# Patient Record
Sex: Female | Born: 2000 | Race: White | Hispanic: No | Marital: Single | Smoking: Never smoker
Health system: Southern US, Community
[De-identification: ages and names within clinical notes are randomized; demographics above are authoritative.]

## PROBLEM LIST (undated history)

## (undated) DIAGNOSIS — J45909 Unspecified asthma, uncomplicated: Secondary | ICD-10-CM

---
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
## 2018-09-07 IMAGING — CR DG THORACIC SPINE 2V
3 series · 3 of 3 positions shown · non-contrast
Comparison: None.

CLINICAL DATA: Back pain

EXAM:
THORACIC SPINE 2 VIEWS

[t thoracic spine ap]
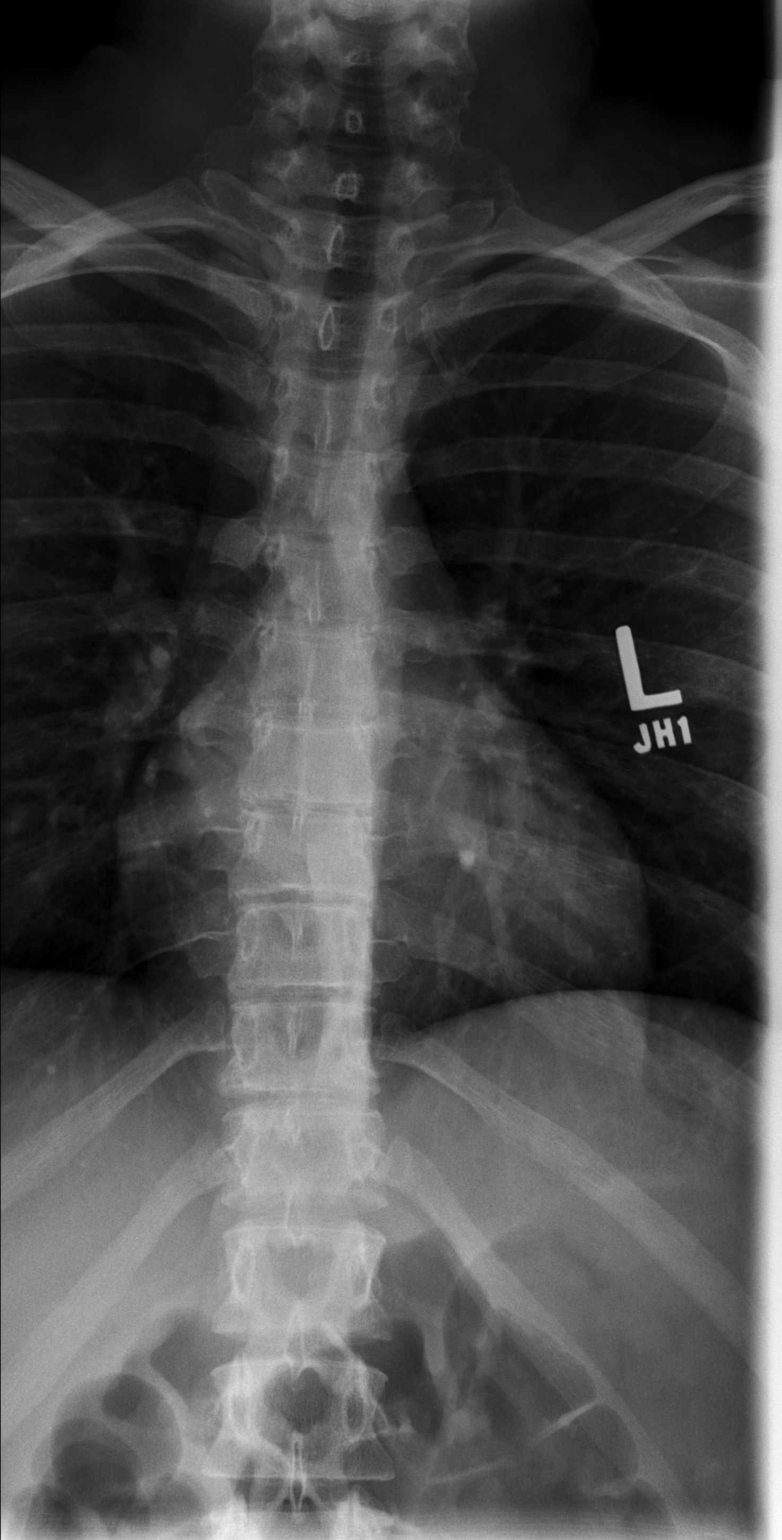

[t thoracic spine lat]
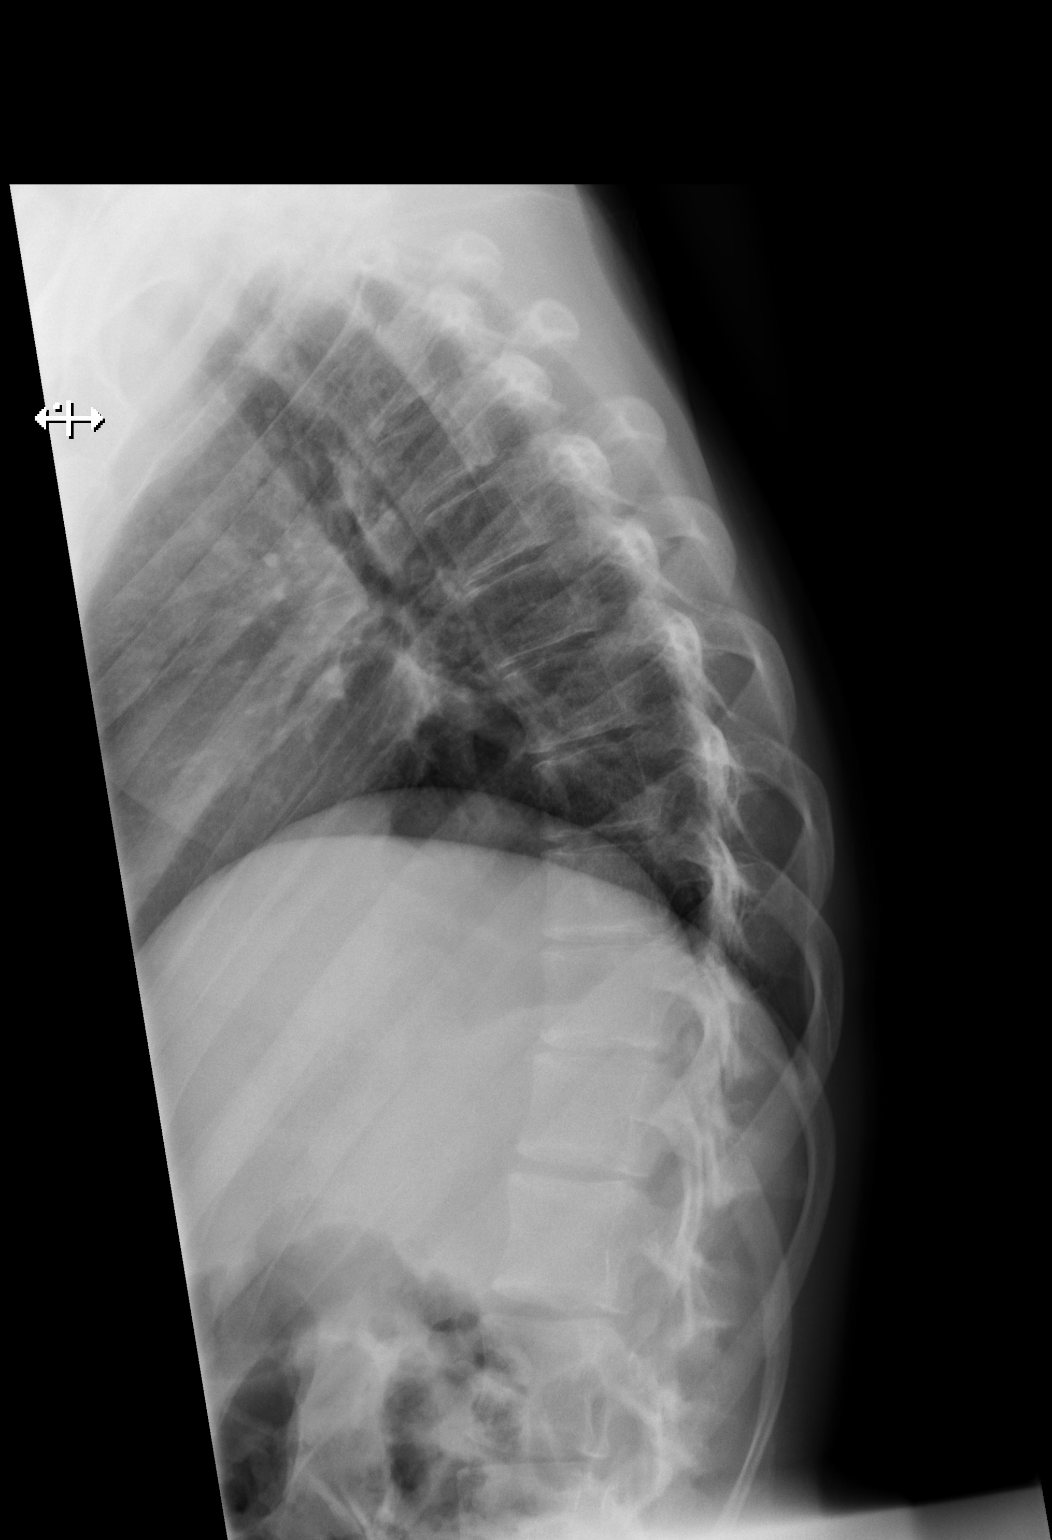

[t thoracic swimmers]
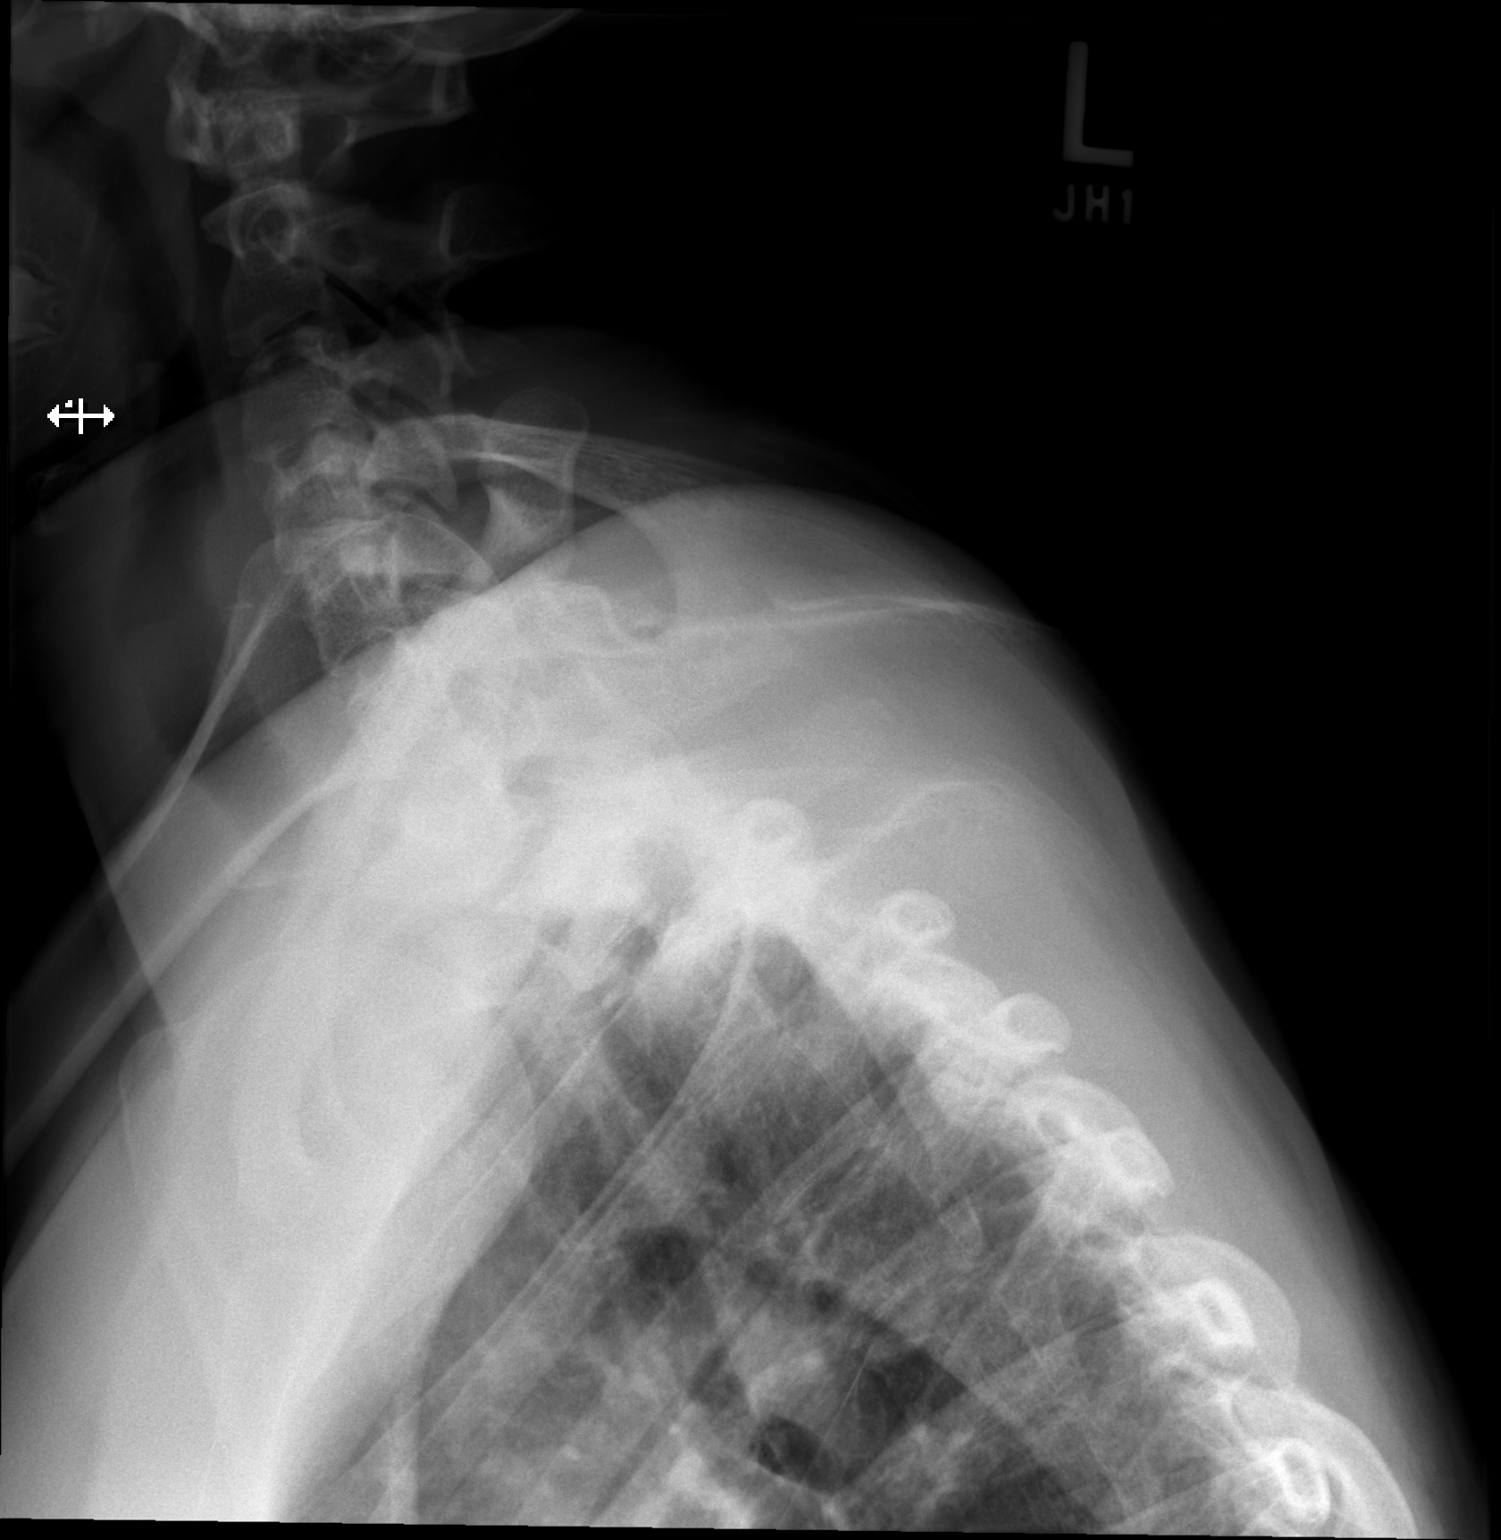

[3 of 3 positions shown; findings below may reference images not displayed]

FINDINGS: Eleven rib pairs. Mild thoracic kyphosis. Vertebral body heights are
normal. Disc spaces are maintained.
IMPRESSION: Negative.

## 2021-07-26 ENCOUNTER — Telehealth: Payer: Self-pay | Admitting: General Practice

## 2021-07-26 ENCOUNTER — Encounter: Payer: Self-pay | Admitting: Family Medicine

## 2021-07-26 ENCOUNTER — Ambulatory Visit (INDEPENDENT_AMBULATORY_CARE_PROVIDER_SITE_OTHER): Payer: 59 | Admitting: Family Medicine

## 2021-07-26 VITALS — BP 116/62 | HR 87 | Wt 163.0 lb

## 2021-07-26 DIAGNOSIS — Z3400 Encounter for supervision of normal first pregnancy, unspecified trimester: Secondary | ICD-10-CM

## 2021-07-26 DIAGNOSIS — O3680X0 Pregnancy with inconclusive fetal viability, not applicable or unspecified: Secondary | ICD-10-CM | POA: Diagnosis not present

## 2021-07-26 NOTE — Progress Notes (Signed)
Last Pap: 04/23/2020 at [facility].  Loghan Kurtzman l Shequita Peplinski, CMA

## 2021-07-26 NOTE — Progress Notes (Signed)
   Subjective:    Patient ID: Catherine Collier, female    DOB: 09-Jul-2000, 21 y.o.   MRN: [id number]  HPI  Patient seen for initial OB exam, however has very irregular periods and a goes 6 to 7 weeks with between periods at times.  Having mild nausea.  LMP 5/14, which would put her at 7 weeks and 5 days.  Review of Systems     Objective:   Physical Exam Vitals reviewed.  Constitutional:      Appearance: Normal appearance.  Skin:    Capillary Refill: Capillary refill takes less than 2 seconds.  Neurological:     General: No focal deficit present.     Mental Status: She is alert.  Psychiatric:        Mood and Affect: Mood normal.        Behavior: Behavior normal.        Thought Content: Thought content normal.       Assessment & Plan:  1. Pregnancy with uncertain fetal viability, single or unspecified fetus Discussed results.  She has an intrauterine gestational sac with yolk sac that measures 5 weeks 5 days.  We will follow-up in 2 weeks for viability.  Discussed warning signs of miscarriage including bleeding, spotting, cramping.  Patient to call with any vaginal bleeding.  Also relayed to the patient that she can go to the maternity assessment unit at [facility] for any emergent needs.

## 2021-07-26 NOTE — Telephone Encounter (Signed)
Patient aware of insurance ([employer]) will not be in network with [employer] effective 09/20/2021.  Pt verbalized understanding.

## 2021-07-26 NOTE — Progress Notes (Signed)
DATING AND VIABILITY SONOGRAM   Catherine Collier is a 21 y.o. year old G1P0 with LMP Patient's last menstrual period was 06/02/2021. which would correlate to  [redacted]w[redacted]d weeks gestation.  She has irregular menstrual cycles.   She is here today for a confirmatory initial sonogram.    GESTATION: SINGLETON     FETAL ACTIVITY:        Gestational sac visualized   ADNEXA: The ovaries are normal.   GESTATIONAL AGE AND  BIOMETRICS:  Gestational criteria: Estimated Date of Delivery: 03/09/22 by LMP now at [redacted]w[redacted]d  Previous Scans:0  GESTATIONAL SAC           1.13 mm         5.5 weeks      1.04 cm      5-5 weeks                                                                               AVERAGE EGA(BY THIS SCAN):  5-5 weeks  WORKING EDD( early ultrasound ):  03-23-2022  Will plan to repeat ultrasound in 2 weeks.     TECHNICIAN COMMENTS:  Patient informed that the ultrasound is considered a limited obstetric ultrasound and is not intended to be a complete ultrasound exam.  Patient also informed that the ultrasound is not being completed with the intent of assessing for fetal or placental anomalies or any pelvic abnormalities. Explained that the purpose of today's ultrasound is to assess for fetal heart rate.  Patient acknowledges the purpose of the exam and the limitations of the study.    Armandina Stammer 07/26/2021 11:14 AM

## 2021-07-29 ENCOUNTER — Encounter: Payer: Self-pay | Admitting: General Practice

## 2021-08-09 ENCOUNTER — Ambulatory Visit (INDEPENDENT_AMBULATORY_CARE_PROVIDER_SITE_OTHER): Payer: 59

## 2021-08-09 VITALS — BP 116/61 | HR 86

## 2021-08-09 DIAGNOSIS — Z3400 Encounter for supervision of normal first pregnancy, unspecified trimester: Secondary | ICD-10-CM

## 2021-08-09 DIAGNOSIS — O3680X0 Pregnancy with inconclusive fetal viability, not applicable or unspecified: Secondary | ICD-10-CM

## 2021-08-09 NOTE — Progress Notes (Signed)
Patient presents for repeat ultrasound for viability. DATING AND VIABILITY SONOGRAM   Catherine Collier is a 21 y.o. year old G1P0 with LMP Patient's last menstrual period was 06/02/2021. which would correlate to  [redacted]w[redacted]d weeks gestation.  She has regular menstrual cycles.   She is here today for a confirmatory initial sonogram.    GESTATION: SINGLETON     FETAL ACTIVITY:          Heart rate         143 bpm          The fetus is active.    ADNEXA: The ovaries are normal.   GESTATIONAL AGE AND  BIOMETRICS:  Gestational criteria: Estimated Date of Delivery: 03/23/22 by early ultrasound now at [redacted]w[redacted]d  Previous Scans:0      CROWN RUMP LENGTH          1.21 cm         7-3 weeks      1.38 cm 7-5                                                                           AVERAGE EGA(BY THIS SCAN):  7-5 weeks  WORKING EDD( early ultrasound ):  03-23-2022     TECHNICIAN COMMENTS: Patient informed that the ultrasound is considered a limited obstetric ultrasound and is not intended to be a complete ultrasound exam.  Patient also informed that the ultrasound is not being completed with the intent of assessing for fetal or placental anomalies or any pelvic abnormalities. Explained that the purpose of today's ultrasound is to assess for fetal heart rate.  Patient acknowledges the purpose of the exam and the limitations of the study.      Armandina Stammer 08/09/2021 8:42 AM

## 2021-08-31 DIAGNOSIS — K219 Gastro-esophageal reflux disease without esophagitis: Secondary | ICD-10-CM | POA: Insufficient documentation

## 2021-08-31 NOTE — Progress Notes (Unsigned)
History:   Catherine Collier is a 21 y.o. G1P0 at [redacted]w[redacted]d by early ultrasound being seen today for her first obstetrical visit.   Patient  unsure if she  intends to breast feed or bottlefeed.   Patient reports no complaints except occasional nausea, no emesis.      HISTORY: OB History  Gravida Para Term Preterm AB Living  1 0 0 0 0 0  SAB IAB Ectopic Multiple Live Births  0 0 0 0 0    # Outcome Date GA Lbr Len/2nd Weight Sex Delivery Anes PTL Lv  1 Current              Past Medical History:  Diagnosis Date   Asthma    History reviewed. No pertinent surgical history. Family History  Problem Relation Age of Onset   Hypothyroidism Mother    Social History   Tobacco Use   Smoking status: Never   Smokeless tobacco: Never  Vaping Use   Vaping Use: Never used  Substance Use Topics   Alcohol use: No   Drug use: No   Allergies  Allergen Reactions   Penicillins Hives and Rash   Current Outpatient Medications on File Prior to Visit  Medication Sig Dispense Refill   Clobetasol Propionate 0.05 % shampoo Apply 1 Application topically daily.     Prenatal Vit-Fe Fumarate-FA (MULTIVITAMIN-PRENATAL) 27-0.8 MG TABS tablet Take 1 tablet by mouth daily at 12 noon.     No current facility-administered medications on file prior to visit.    Review of Systems Pertinent items noted in HPI and remainder of comprehensive ROS otherwise negative.  Physical Exam:   Vitals:   09/02/21 0817  BP: 114/66  Pulse: 63  Weight: 163 lb (73.9 kg)   Fetal Heart Rate (bpm): 169  General: well-developed, well-nourished female in no acute distress  Breasts:  Deferred  Skin: normal coloration and turgor, no rashes  Neurologic: oriented, normal, negative, normal mood  Extremities: normal strength, tone, and muscle mass, ROM of all joints is normal  HEENT PERRLA, extraocular movement intact and sclera clear, anicteric  Neck supple and no masses  Cardiovascular: regular rate and rhythm   Respiratory:  no respiratory distress, normal breath sounds  Abdomen: soft, non-tender; bowel sounds normal; no masses,  no organomegaly  Pelvic: Deferred    Assessment:    Pregnancy: G1P0 Patient Active Problem List   Diagnosis Date Noted   Supervision of normal first pregnancy, antepartum 07/26/2021     Plan:    1. Supervision of normal first pregnancy, antepartum Initial labs drawn. Cultures done Continue prenatal vitamins. Problem list reviewed and updated. Genetic Screening discussed, NIPS: requested. She does not know if she would like the [facility]. Ultrasound discussed; fetal anatomic survey: ordered. Anticipatory guidance about prenatal visits given including labs, ultrasounds, and testing. Discussed usage of Babyscripts and virtual visits  2. Gastroesophageal reflux disease without esophagitis She does not actually have GERD - had stomach pains and tried prilosec for a couple weeks but did not improve so she stopped it.      The nature of [facility]Faculty Practice with multiple MDs and Advanced Practice Providers was explained to patient; also emphasized that residents, students are part of our team. Routine obstetric precautions reviewed. Encouraged to seek out care at office or emergency room [facility] preferred) for urgent and/or emergent concerns. Return in about 4 weeks (around 09/30/2021) for OB VISIT, MD or APP.    Milas Hock,  MD, [place], [facility] for [employer], [place]

## 2021-09-02 ENCOUNTER — Ambulatory Visit (INDEPENDENT_AMBULATORY_CARE_PROVIDER_SITE_OTHER): Payer: 59 | Admitting: Obstetrics and Gynecology

## 2021-09-02 ENCOUNTER — Other Ambulatory Visit (HOSPITAL_COMMUNITY)
Admission: RE | Admit: 2021-09-02 | Discharge: 2021-09-02 | Disposition: A | Discharge status: Home / Self Care | Payer: 59 | Source: Ambulatory Visit | Attending: Obstetrics and Gynecology | Admitting: Obstetrics and Gynecology

## 2021-09-02 ENCOUNTER — Encounter: Payer: Self-pay | Admitting: General Practice

## 2021-09-02 ENCOUNTER — Encounter: Payer: Self-pay | Admitting: Obstetrics and Gynecology

## 2021-09-02 VITALS — BP 114/66 | HR 63 | Wt 163.0 lb

## 2021-09-02 DIAGNOSIS — Z3A11 11 weeks gestation of pregnancy: Secondary | ICD-10-CM

## 2021-09-02 DIAGNOSIS — Z3400 Encounter for supervision of normal first pregnancy, unspecified trimester: Secondary | ICD-10-CM | POA: Insufficient documentation

## 2021-09-02 DIAGNOSIS — R8271 Bacteriuria: Secondary | ICD-10-CM

## 2021-09-02 DIAGNOSIS — Z3401 Encounter for supervision of normal first pregnancy, first trimester: Secondary | ICD-10-CM

## 2021-09-02 DIAGNOSIS — K219 Gastro-esophageal reflux disease without esophagitis: Secondary | ICD-10-CM | POA: Diagnosis not present

## 2021-09-02 NOTE — Addendum Note (Signed)
Addended by: Lorelle Gibbs L on: 09/02/2021 09:08 AM   Modules accepted: Orders

## 2021-09-03 ENCOUNTER — Encounter: Payer: Self-pay | Admitting: Obstetrics and Gynecology

## 2021-09-03 DIAGNOSIS — O26899 Other specified pregnancy related conditions, unspecified trimester: Secondary | ICD-10-CM | POA: Insufficient documentation

## 2021-09-03 LAB — CBC/D/PLT+RPR+RH+ABO+RUBIGG...
Antibody Screen: NEGATIVE
Basophils Absolute: 0 10*3/uL (ref 0.0–0.2)
Basos: 0 %
EOS (ABSOLUTE): 0.1 10*3/uL (ref 0.0–0.4)
Eos: 1 %
HCV Ab: NONREACTIVE
HIV Screen 4th Generation wRfx: NONREACTIVE
Hematocrit: 36.4 % (ref 34.0–46.6)
Hemoglobin: 12.2 g/dL (ref 11.1–15.9)
Hepatitis B Surface Ag: NEGATIVE
Immature Grans (Abs): 0 10*3/uL (ref 0.0–0.1)
Immature Granulocytes: 0 %
Lymphocytes Absolute: 1.6 10*3/uL (ref 0.7–3.1)
Lymphs: 15 %
MCH: 30.6 pg (ref 26.6–33.0)
MCHC: 33.5 g/dL (ref 31.5–35.7)
MCV: 91 fL (ref 79–97)
Monocytes Absolute: 0.6 10*3/uL (ref 0.1–0.9)
Monocytes: 5 %
Neutrophils Absolute: 8.1 10*3/uL — ABNORMAL HIGH (ref 1.4–7.0)
Neutrophils: 79 %
Platelets: 251 10*3/uL (ref 150–450)
RBC: 3.99 x10E6/uL (ref 3.77–5.28)
RDW: 12 % (ref 11.7–15.4)
RPR Ser Ql: NONREACTIVE
Rh Factor: NEGATIVE
Rubella Antibodies, IGG: 1.25 index (ref >0.99)
WBC: 10.3 10*3/uL (ref 3.4–10.8)

## 2021-09-03 LAB — GC/CHLAMYDIA PROBE AMP (~~LOC~~) NOT AT ARMC
Chlamydia: NEGATIVE
Comment: NEGATIVE
Comment: NORMAL
Neisseria Gonorrhea: NEGATIVE

## 2021-09-03 LAB — HCV INTERPRETATION

## 2021-09-06 ENCOUNTER — Encounter: Payer: Self-pay | Admitting: General Practice

## 2021-09-06 LAB — CULTURE, OB URINE

## 2021-09-06 LAB — URINE CULTURE, OB REFLEX

## 2021-09-07 LAB — PANORAMA PRENATAL TEST FULL PANEL:PANORAMA TEST PLUS 5 ADDITIONAL MICRODELETIONS: FETAL FRACTION: 8

## 2021-09-09 ENCOUNTER — Encounter: Payer: Self-pay | Admitting: Obstetrics and Gynecology

## 2021-09-09 DIAGNOSIS — R8271 Bacteriuria: Secondary | ICD-10-CM | POA: Insufficient documentation

## 2021-09-09 MED ORDER — CEFADROXIL 500 MG PO CAPS: 500.0000 mg | ORAL_CAPSULE | Freq: Two times a day (BID) | ORAL | 0 refills | Status: AC

## 2021-09-09 NOTE — Addendum Note (Signed)
Addended by: Milas Hock A on: 09/09/2021 10:02 AM   Modules accepted: Orders

## 2021-09-10 ENCOUNTER — Encounter: Payer: Self-pay | Admitting: Obstetrics and Gynecology

## 2021-10-09 ENCOUNTER — Encounter: Payer: Medicaid Other | Admitting: Family Medicine

## 2021-10-28 ENCOUNTER — Ambulatory Visit: Payer: Medicaid Other

## 2021-11-06 ENCOUNTER — Encounter: Payer: 59 | Admitting: Family Medicine

## 2022-02-03 ENCOUNTER — Ambulatory Visit: Payer: Medicaid Other | Admitting: Dermatology
